# Patient Record
Sex: Female | Born: 1980 | Race: Black or African American
Health system: Southern US, Community
[De-identification: ages and names within clinical notes are randomized; demographics above are authoritative.]

## PROBLEM LIST (undated history)

## (undated) HISTORY — PX: HYSTERECTOMY: SHX81

---

## 2003-04-08 ENCOUNTER — Emergency Department: Admit: 2003-04-08 | Payer: Self-pay | Admitting: Emergency Medicine

## 2003-07-22 ENCOUNTER — Emergency Department: Admit: 2003-07-22 | Payer: Self-pay | Source: Emergency Department | Admitting: Emergency Medicine

## 2003-10-04 ENCOUNTER — Emergency Department: Admit: 2003-10-04 | Payer: Self-pay | Source: Ambulatory Visit | Admitting: Emergency Medicine

## 2003-10-24 ENCOUNTER — Emergency Department: Admit: 2003-10-24 | Payer: Self-pay

## 2021-03-16 ENCOUNTER — Emergency Department: Payer: Commercial Managed Care - POS

## 2021-03-16 ENCOUNTER — Observation Stay
Admission: EM | Admit: 2021-03-16 | Discharge: 2021-03-17 | Disposition: A | Discharge status: Home / Self Care | Payer: Commercial Managed Care - POS | Attending: Student in an Organized Health Care Education/Training Program | Admitting: Student in an Organized Health Care Education/Training Program

## 2021-03-16 DIAGNOSIS — D509 Iron deficiency anemia, unspecified: Secondary | ICD-10-CM | POA: Insufficient documentation

## 2021-03-16 DIAGNOSIS — R9431 Abnormal electrocardiogram [ECG] [EKG]: Secondary | ICD-10-CM | POA: Insufficient documentation

## 2021-03-16 DIAGNOSIS — R072 Precordial pain: Principal | ICD-10-CM | POA: Insufficient documentation

## 2021-03-16 DIAGNOSIS — Z20822 Contact with and (suspected) exposure to covid-19: Secondary | ICD-10-CM | POA: Insufficient documentation

## 2021-03-16 DIAGNOSIS — Z880 Allergy status to penicillin: Secondary | ICD-10-CM | POA: Insufficient documentation

## 2021-03-16 DIAGNOSIS — R0602 Shortness of breath: Secondary | ICD-10-CM | POA: Insufficient documentation

## 2021-03-16 DIAGNOSIS — R079 Chest pain, unspecified: Secondary | ICD-10-CM | POA: Diagnosis present

## 2021-03-16 DIAGNOSIS — R0789 Other chest pain: Secondary | ICD-10-CM

## 2021-03-16 LAB — CBC AND DIFFERENTIAL
Absolute NRBC: 0 10*3/uL (ref 0.00–0.00)
Basophils Absolute Automated: 0.05 10*3/uL (ref 0.00–0.08)
Basophils Automated: 1 %
Eosinophils Absolute Automated: 0.01 10*3/uL (ref 0.00–0.44)
Eosinophils Automated: 0.2 %
Hematocrit: 29.2 % — ABNORMAL LOW (ref 34.7–43.7)
Hgb: 8.9 g/dL — ABNORMAL LOW (ref 11.4–14.8)
Immature Granulocytes Absolute: 0.02 10*3/uL (ref 0.00–0.07)
Immature Granulocytes: 0.4 %
Lymphocytes Absolute Automated: 1.01 10*3/uL (ref 0.42–3.22)
Lymphocytes Automated: 20 %
MCH: 18.3 pg — ABNORMAL LOW (ref 25.1–33.5)
MCHC: 30.5 g/dL — ABNORMAL LOW (ref 31.5–35.8)
MCV: 60 fL — ABNORMAL LOW (ref 78.0–96.0)
Monocytes Absolute Automated: 0.39 10*3/uL (ref 0.21–0.85)
Monocytes: 7.7 %
Neutrophils Absolute: 3.57 10*3/uL (ref 1.10–6.33)
Neutrophils: 70.7 %
Nucleated RBC: 0 /100 WBC (ref 0.0–0.0)
Platelets: 479 10*3/uL — ABNORMAL HIGH (ref 142–346)
RBC: 4.87 10*6/uL (ref 3.90–5.10)
RDW: 20 % — ABNORMAL HIGH (ref 11–15)
WBC: 5.05 10*3/uL (ref 3.10–9.50)

## 2021-03-16 LAB — IHS 2ND ABORH REQUEST

## 2021-03-16 LAB — COMPREHENSIVE METABOLIC PANEL
ALT: 10 U/L (ref 0–55)
AST (SGOT): 13 U/L (ref 5–34)
Albumin/Globulin Ratio: 0.9 (ref 0.9–2.2)
Albumin: 4.1 g/dL (ref 3.5–5.0)
Alkaline Phosphatase: 63 U/L (ref 37–117)
Anion Gap: 9 (ref 5.0–15.0)
BUN: 7 mg/dL (ref 7.0–19.0)
Bilirubin, Total: 1.1 mg/dL (ref 0.2–1.2)
CO2: 23 mEq/L (ref 22–29)
Calcium: 9.8 mg/dL (ref 8.5–10.5)
Chloride: 106 mEq/L (ref 100–111)
Creatinine: 0.7 mg/dL (ref 0.6–1.0)
Globulin: 4.7 g/dL — ABNORMAL HIGH (ref 2.0–3.6)
Glucose: 75 mg/dL (ref 70–100)
Potassium: 3.9 mEq/L (ref 3.5–5.1)
Protein, Total: 8.8 g/dL — ABNORMAL HIGH (ref 6.0–8.3)
Sodium: 138 mEq/L (ref 136–145)

## 2021-03-16 LAB — ECG 12-LEAD
Atrial Rate: 105 {beats}/min
P Axis: 57 degrees
P-R Interval: 152 ms
Q-T Interval: 358 ms
QRS Duration: 82 ms
QTC Calculation (Bezet): 473 ms
R Axis: -19 degrees
T Axis: 59 degrees
Ventricular Rate: 105 {beats}/min

## 2021-03-16 LAB — URINALYSIS, REFLEX TO MICROSCOPIC EXAM IF INDICATED
Bilirubin, UA: NEGATIVE
Blood, UA: NEGATIVE
Glucose, UA: NEGATIVE
Ketones UA: NEGATIVE
Nitrite, UA: NEGATIVE
Specific Gravity UA: >=1.03 (ref 1.001–1.035)
Urine pH: 6 (ref 5.0–8.0)
Urobilinogen, UA: 0.2 mg/dL (ref 0.2–2.0)

## 2021-03-16 LAB — TROPONIN I
Troponin I: <0.01 ng/mL (ref 0.00–0.05)
Troponin I: 0.01 ng/mL (ref 0.00–0.05)

## 2021-03-16 LAB — CELL MORPHOLOGY
Cell Morphology: ABNORMAL — AB
Platelet Estimate: INCREASED — AB

## 2021-03-16 LAB — HCG QUANTITATIVE: hCG, Quant.: <2.4 m[IU]/mL

## 2021-03-16 LAB — GFR: EGFR: >60

## 2021-03-16 LAB — TYPE AND SCREEN
AB Screen Gel: NEGATIVE
ABO Rh: O POS

## 2021-03-16 LAB — COVID-19 (SARS-COV-2): SARS CoV-2: NEGATIVE

## 2021-03-16 LAB — ABO/RH: ABO Rh: O POS

## 2021-03-16 MED ORDER — ACETAMINOPHEN 650 MG RE SUPP
650.0000 mg | Freq: Four times a day (QID) | RECTAL | Status: DC | PRN
Start: 2021-03-16 — End: 2021-03-17

## 2021-03-16 MED ORDER — MAGNESIUM SULFATE IN D5W 1-5 GM/100ML-% IV SOLN
1.0000 g | INTRAVENOUS | Status: DC | PRN
Start: 2021-03-16 — End: 2021-03-17

## 2021-03-16 MED ORDER — ACETAMINOPHEN 325 MG PO TABS
650.0000 mg | ORAL_TABLET | Freq: Four times a day (QID) | ORAL | Status: DC | PRN
Start: 2021-03-16 — End: 2021-03-17

## 2021-03-16 MED ORDER — DEXTROSE 50 % IV SOLN
25.0000 g | INTRAVENOUS | Status: DC | PRN
Start: 2021-03-16 — End: 2021-03-17

## 2021-03-16 MED ORDER — OXYCODONE-ACETAMINOPHEN 5-325 MG PO TABS
1.0000 | ORAL_TABLET | ORAL | Status: DC | PRN
Start: 2021-03-16 — End: 2021-03-17
  Administered 2021-03-16: 1 via ORAL
  Filled 2021-03-16: qty 1

## 2021-03-16 MED ORDER — FENTANYL CITRATE (PF) 50 MCG/ML IJ SOLN (WRAP)
25.0000 ug | Freq: Once | INTRAMUSCULAR | Status: AC
Start: 2021-03-16 — End: 2021-03-16
  Administered 2021-03-16: 25 ug via INTRAVENOUS
  Filled 2021-03-16: qty 2

## 2021-03-16 MED ORDER — IOHEXOL 350 MG/ML IV SOLN
100.0000 mL | Freq: Once | INTRAVENOUS | Status: AC | PRN
Start: 2021-03-16 — End: 2021-03-16
  Administered 2021-03-16: 100 mL via INTRAVENOUS

## 2021-03-16 MED ORDER — POTASSIUM CHLORIDE CRYS ER 10 MEQ PO TBCR
0.0000 meq | EXTENDED_RELEASE_TABLET | ORAL | Status: DC | PRN
Start: 2021-03-16 — End: 2021-03-17

## 2021-03-16 MED ORDER — ONDANSETRON HCL 4 MG/2ML IJ SOLN
4.0000 mg | Freq: Four times a day (QID) | INTRAMUSCULAR | Status: DC | PRN
Start: 2021-03-16 — End: 2021-03-17

## 2021-03-16 MED ORDER — DEXTROSE 10 % IV BOLUS
25.0000 g | INTRAVENOUS | Status: DC | PRN
Start: 2021-03-16 — End: 2021-03-17

## 2021-03-16 MED ORDER — NALOXONE HCL 0.4 MG/ML IJ SOLN (WRAP)
0.2000 mg | INTRAMUSCULAR | Status: DC | PRN
Start: 2021-03-16 — End: 2021-03-17

## 2021-03-16 MED ORDER — MELATONIN 3 MG PO TABS
3.0000 mg | ORAL_TABLET | Freq: Every evening | ORAL | Status: DC | PRN
Start: 2021-03-16 — End: 2021-03-17

## 2021-03-16 MED ORDER — POTASSIUM CHLORIDE 10 MEQ/100ML IV SOLN
10.0000 meq | INTRAVENOUS | Status: DC | PRN
Start: 2021-03-16 — End: 2021-03-17

## 2021-03-16 MED ORDER — ENOXAPARIN SODIUM 40 MG/0.4ML IJ SOSY
40.0000 mg | PREFILLED_SYRINGE | Freq: Every day | INTRAMUSCULAR | Status: DC
Start: 2021-03-16 — End: 2021-03-17
  Administered 2021-03-16: 19:00:00 40 mg via SUBCUTANEOUS
  Filled 2021-03-16 (×2): qty 0.4

## 2021-03-16 MED ORDER — POTASSIUM & SODIUM PHOSPHATES 280-160-250 MG PO PACK
2.0000 | PACK | ORAL | Status: DC | PRN
Start: 2021-03-16 — End: 2021-03-17

## 2021-03-16 MED ORDER — FERROUS SULFATE 324 (65 FE) MG PO TBEC
324.0000 mg | DELAYED_RELEASE_TABLET | Freq: Every day | ORAL | Status: DC
Start: 2021-03-17 — End: 2021-03-17
  Administered 2021-03-17: 324 mg via ORAL
  Filled 2021-03-16: qty 1

## 2021-03-16 MED ORDER — ONDANSETRON 4 MG PO TBDP
4.0000 mg | ORAL_TABLET | Freq: Four times a day (QID) | ORAL | Status: DC | PRN
Start: 2021-03-16 — End: 2021-03-17

## 2021-03-16 MED ORDER — GLUCAGON 1 MG IJ SOLR (WRAP)
1.0000 mg | INTRAMUSCULAR | Status: DC | PRN
Start: 2021-03-16 — End: 2021-03-17

## 2021-03-16 MED ORDER — DEXTROSE 5% IV BOLUS
250.0000 mL | INTRAVENOUS | Status: DC | PRN
Start: 2021-03-16 — End: 2021-03-17

## 2021-03-16 NOTE — ED Triage Notes (Signed)
Pt. BIB husband c/o CP, SOB.  Pt. States she had hysterectomy x3 weeks ago.  Pt. Denies n/v/d.  Pt. Is A+Ox4, moving all extremities with no other medical complaints.    BP 111/73    Pulse 95    Temp 99.7 F (37.6 C)    Resp (!) 26    SpO2 99%

## 2021-03-16 NOTE — H&P (Signed)
[facility]      Patient: Heather Bond  Date: 03/16/2021   DOB: Sep 12, 1980  Admission Date: 03/16/2021   MRN: [id number]  Attending: Derek Mound, MD         Chief Complaint   Patient presents with    Chest Pain    Shortness of Breath      History Gathered From: Patient     HISTORY AND PHYSICAL     Heather Bond is a 40 y.o. female with a PMHx of anemia who presented with chest tightness, shortness of breath and "fogginess" and broke out in a rash on forehead. Initially attributed this to an allergic reaction so took benadryl and wen to sleep. This morning she felt similar symptoms so decided to come in to the ED. She states that she has been going through a lot of stressors. She recently has been dealing with the sudden death of her cousin on 01-06-23. She reports abd pain from recently surgery- hysterectomy. Denies nausea/vomiting.     In the ED:  CTA chest: negative for PE     EKG NSR with lateral T wave inverstions   Troponin negative x1     Cardiology consulted [facility] recommend admission and stress test.     History reviewed. No pertinent past medical history.    Past Surgical History:   Procedure Laterality Date    HYSTERECTOMY         Prior to Admission medications    Medication Sig Start Date End Date Taking? Authorizing Provider   ferrous sulfate 325 (65 FE) MG tablet Take 325 mg by mouth every morning with breakfast    [provider]   ibuprofen (ADVIL) 800 MG tablet Take 800 mg by mouth every 6 (six) hours as needed for Pain    [provider]   Meth-Hyo-M Bl-Na Phos-Ph Sal (URO-MP PO) Take by mouth    [provider]   naproxen sodium (ANAPROX) 550 MG tablet Take 550 mg by mouth 2 (two) times daily with meals    [provider]   ondansetron (ZOFRAN-ODT) 4 MG disintegrating tablet Take 4 mg by mouth every 8 (eight) hours as needed for Nausea    [provider]   oxyCODONE-acetaminophen (PERCOCET) 5-325 MG per tablet Take 1 tablet by mouth  every 4 (four) hours as needed for Pain    [provider]       Allergies   Allergen Reactions    Penicillins        CODE STATUS: Full    PRIMARY CARE MD: No primary care provider on file.    Family hx:  Mother- denies htn, dm, malignancy, heart disease   Father- as above     Social History     Tobacco Use    Smoking status: Never    Smokeless tobacco: Never   Vaping Use    Vaping Use: Never used   Substance Use Topics    Alcohol use: Never    Drug use: Never       REVIEW OF SYSTEMS   As above     PHYSICAL EXAM     Vital Signs (most recent): BP 102/63    Pulse 79    Temp 99.7 F (37.6 C)    Resp 18    Ht 1.753 m (5\' 9" )    Wt 63.5 kg (140 lb)    SpO2 100%    BMI 20.67 kg/m   Constitutional: No apparent distress. Patient speaks freely  in full sentences.   HEENT: NC/AT, PERRL, no scleral icterus or conjunctival pallor, no nasal discharge, MMM, oropharynx without erythema or exudate  Neck: trachea midline, supple, no cervical or supraclavicular lymphadenopathy or masses  Cardiovascular: RRR, normal S1 S2, no murmurs, gallops, palpable thrills, no JVD, Non-displaced PMI.  Respiratory: Normal rate. No retractions or increased work of breathing. Clear to auscultation and percussion bilaterally.  Gastrointestinal: +BS, non-distended, soft, non-tender, no rebound or guarding, no hepatosplenomegaly  Genitourinary: no suprapubic or costovertebral angle tenderness  Musculoskeletal: ROM and motor strength grossly normal. No clubbing, edema, or cyanosis. DP and radial pulses 2+ and symmetric.  Skin exam:  pink  Neurologic: non focal, moves all extremities   Psychiatric: AAOx3, affect and mood appropriate. The patient is alert, interactive, appropriate.  Capillary refill:  Normal    Exam done by Derek Mound, MD on 03/16/21 at 2:14 PM      LABS & IMAGING     Recent Results (from the past 24 hour(s))   COVID-19 (SARS-COV-2) ([facility])    Collection Time: 03/16/21 12:01 PM    Specimen: Nasopharynx; Nasopharyngeal  Swab   Result Value Ref Range    Purpose of COVID testing Screening     SARS-CoV-2 Specimen Source Nasopharyngeal     SARS CoV-2 Negative    CBC and differential    Collection Time: 03/16/21 12:01 PM   Result Value Ref Range    WBC 5.05 3.10 - 9.50 x10 3/uL    Hgb 8.9 (L) 11.4 - 14.8 g/dL    Hematocrit 16.1 (L) 34.7 - 43.7 %    Platelets 479 (H) 142 - 346 x10 3/uL    RBC 4.87 3.90 - 5.10 x10 6/uL    MCV 60.0 (L) 78.0 - 96.0 fL    MCH 18.3 (L) 25.1 - 33.5 pg    MCHC 30.5 (L) 31.5 - 35.8 g/dL    RDW 20 (H) 11 - 15 %    MPV Unmeasured 8.9 - 12.5 fL    Nucleated RBC 0.0 0.0 - 0.0 /100 WBC    Absolute NRBC 0.00 0.00 - 0.00 x10 3/uL   Comprehensive metabolic panel    Collection Time: 03/16/21 12:01 PM   Result Value Ref Range    Glucose 75 70 - 100 mg/dL    BUN 7.0 7.0 - 09.6 mg/dL    Creatinine 0.7 0.6 - 1.0 mg/dL    Sodium 045 409 - 811 mEq/L    Potassium 3.9 3.5 - 5.1 mEq/L    Chloride 106 100 - 111 mEq/L    CO2 23 22 - 29 mEq/L    Calcium 9.8 8.5 - 10.5 mg/dL    Protein, Total 8.8 (H) 6.0 - 8.3 g/dL    Albumin 4.1 3.5 - 5.0 g/dL    AST (SGOT) 13 5 - 34 U/L    ALT 10 0 - 55 U/L    Alkaline Phosphatase 63 37 - 117 U/L    Bilirubin, Total 1.1 0.2 - 1.2 mg/dL    Globulin 4.7 (H) 2.0 - 3.6 g/dL    Albumin/Globulin Ratio 0.9 0.9 - 2.2    Anion Gap 9.0 5.0 - 15.0   Troponin I    Collection Time: 03/16/21 12:01 PM   Result Value Ref Range    Troponin I <0.01 0.00 - 0.05 ng/mL   GFR    Collection Time: 03/16/21 12:01 PM   Result Value Ref Range    EGFR >60.0    Type and Screen  Collection Time: 03/16/21 12:01 PM   Result Value Ref Range    ABO Rh O POS     AB Screen Gel NEG    2nd ABORH Request    Collection Time: 03/16/21 12:01 PM   Result Value Ref Range    2nd ABORH Request FT    ABO/Rh    Collection Time: 03/16/21 12:47 PM   Result Value Ref Range    ABO Rh O POS      IMAGING:  CT Angio Chest    Result Date: 03/16/2021    CT angiogram of the chest shows no central or detectable peripheral pulmonary embolism. Laurena Slimmer,  MD  03/16/2021 1:12 PM       Markers:  Recent Labs   Lab 03/16/21  1201   Troponin I <0.01       EMERGENCY DEPARTMENT COURSE:  Orders Placed This Encounter   Procedures    COVID-19 (SARS-COV-2) ([facility])    CT Angio Chest    CBC and differential    Comprehensive metabolic panel    Troponin I    GFR    Full Code    ECG 12 Lead    Type and Screen    2nd ABORH Request    ABO/Rh    Admit to Observation       ASSESSMENT & PLAN     Mazi Umstead is a 40 y.o. female admitted under obs with Chest pain.    Patient Active Hospital Problem List:  Chest pain (03/16/2021)     Chest pain, resolved  -Likely 2/2 anxiety given recent stress  -Given abnormal EKG with lateral T wave inversions pt admitted for further workup   -Stress test in am   -Cariology consulted, appreciate recs  -NPO at midnight   -Troponin negative. Serial troponins ordered  -CTA chest negative for PE  -Monitor on tele   -Check lipids and A1c    Iron def anemia   -S/p hysterectomy  -Continue PTA ferrous sulfate   -OP follow up       Nutrition  Regular  NPO at midnight     DVT/VTE Prophylaxis  Lovenox     Signed,  Derek Mound, MD    03/16/2021 2:14 PM  Time Elapsed: 45 minutes

## 2021-03-16 NOTE — Consults (Signed)
[facility] C[facility] CONSULTATION REPORT  [facility]        Date Time: 03/16/21 2:45 PM  Patient Name: Edberg,Melady  Requesting Physician: Derek Mound, MD  Consulting Cardiologist:  Lawerance Bach, MD  MRN:  [id number]  CSN:   [id number]  Date of Admission:  03/16/2021       Reason for Consultation:   Chest pain    History:   Nakisha Chai is a 40 y.o. female admitted on 03/16/2021.  We have been asked by Derek Mound, MD,  to provide cardiac consultation, regarding chest pain.    The patient says she developed chest discomfort (pressure) associated with headache and "fogginess" this morning. She has no known history of coronary disease, diabetes or stroke.    [place] She says she has felt stressed recently with the passing of cousin 3 days ago and the 1st anniversary of her mother's death approaching this week.    Additionally, she was admitted in May 2022 with severe anemia (reportedly Hgb of 2) in the setting of massive uterine bleeding. She then underwent fibroidectomy in July 2022 with subsequent improvement in her blood count. She continues to take prn oxycodone for incisional pain.    She is never smoker and denies alcohol and drug use.    In the ED, initial troponin is negative.  CTA chest:   CT angiogram of the chest shows no central or detectable  peripheral pulmonary embolism.     EKG:  Sinus rhythm with lateral T wave inversions.    Past Medical History:   History reviewed. No pertinent past medical history.    Past Surgical History:     Past Surgical History:   Procedure Laterality Date    HYSTERECTOMY         Family History:   History reviewed. No pertinent family history.    Social History:     Social History     Socioeconomic History    Marital status: Single     Spouse name: Not on file    Number of children: Not on file    Years of education: Not on file    Highest education level: Not on file   Occupational  History    Not on file   Tobacco Use    Smoking status: Never    Smokeless tobacco: Never   Vaping Use    Vaping Use: Never used   Substance and Sexual Activity    Alcohol use: Never    Drug use: Never    Sexual activity: Not on file   Other Topics Concern    Not on file   Social History Narrative    Not on file     Social Determinants of Health     Financial Resource Strain: Not on file   Food Insecurity: Not on file   Transportation Needs: Not on file   Physical Activity: Not on file   Stress: Not on file   Social Connections: Not on file   Intimate Partner Violence: Not on file   Housing Stability: Not on file       Allergies:     Allergies   Allergen Reactions    Penicillins        Medications:   (Not in a hospital admission)           Review of Systems:    Comprehensive review of systems including constitutional, eyes, ears, nose, mouth, throat, cardiovascular,  GI, GU, musculoskeletal, integumentary, respiratory, neurologic, psychiatric, and endocrine is negative other than what is mentioned already in the history of present illness    Physical Exam:     VITAL SIGNS PHYSICAL EXAM   Vitals:    03/16/21 1408   BP: 102/63   Pulse: 79   Resp: 18   Temp:    SpO2: 100%     Temp (24hrs), Avg:99.7 F (37.6 C), Min:99.7 F (37.6 C), Max:99.7 F (37.6 C)      Intake and Output Summary (Last 24 hours) at Date Time  No intake or output data in the 24 hours ending 03/16/21 1445    Telemetry: no changes Physical Exam  General: awake, alert, breathing comfortably, no acute distress  Head: normocephalic  Eyes: Lids & conjunctiva normal  Cardiovascular: regular rate and rhythm, normal S1, S2, no S3, no S4, no murmurs, rubs or gallops  Neck: No JVD;  No Carotid bruit  Lungs: clear to auscultation bilateraly, without wheezing, rhonchi, or rales  Abdomen: soft, non-tender.  Extremities: no edema  Pulse: 2+ radial pulses equal bilaterally   Neurological: No gross motor defect  Psychiatric: Alert and oriented X3, mood and affect  normal  Musculoskeletal: normal strength and tone     Labs Reviewed:     Recent Labs   Lab 03/16/21  1201   Troponin I <0.01             Recent Labs   Lab 03/16/21  1201   Bilirubin, Total 1.1   Protein, Total 8.8*   Albumin 4.1   ALT 10   AST (SGOT) 13             Recent Labs   Lab 03/16/21  1201   WBC 5.05   Hgb 8.9*   Hematocrit 29.2*   Platelets 479*     Recent Labs   Lab 03/16/21  1201   Sodium 138   Potassium 3.9   Chloride 106   CO2 23   BUN 7.0   Creatinine 0.7   EGFR >60.0   Glucose 75   Calcium 9.8       DIAGNOSTIC    EKG:     chest X-ray    Assessment:   Atypical chest pain: Substernal chest pressure in the setting of emotional stress and lateral T wave inversions. No known CAD. Chest pain has now improved. Troponin negative. Negative for pulmonary embolism.    Recommendations:   Regadenoson nuclear stress test.        Signed by: Lawerance Bach, MD    [facility]  MD Spectralink [phone] or [phone] (8am-5pm)  Office/Pager: [phone]  NP Spectralink [phone]   After hours, non urgent consult line [phone]  After Hours, urgent consults [phone]

## 2021-03-16 NOTE — Progress Notes (Signed)
Pt is a 40 years old female admitted to unit with diagnosis of Chest pain.She is alert and oriented ,able to verbalizes needs. On room air  with 100%sat. Oriented to room with call light within reached. Ambulate to the bathroom by self.

## 2021-03-16 NOTE — ED Notes (Signed)
[facility] EMERGENCY DEPARTMENT  ED NURSING NOTE FOR THE RECEIVING INPATIENT NURSE   ED NURSE Francis Dowse [phone]   ED CHARGE RN [phone]   ADMISSION INFORMATION   Heather Bond is a 39 y.o. female admitted with a diagnosis of:    1. Chest pain         Isolation: None   Allergies: Penicillins   Holding Orders confirmed? Yes   Belongings Documented? Yes   Home medications sent to pharmacy confirmed? No   NURSING CARE   Patient Comes From:   Mental Status: Home Independent  alert and oriented   ADL: Independent with all ADLs   Ambulation: no difficulty   Pertinent Information  and Safety Concerns: Pt here for observation of chest pain and SOB. Pt recently had hysterectomy and required several blood transfusions.      COVID Test sent to lab? Yes   VITAL SIGNS   Time BP Temp Pulse Resp SpO2   1408 102/63 99.7 79 18 100   CT / NIH   CT Head ordered on this patient?  No   NIH/Dysphagia assessment done prior to admission? No   PERSONAL PROTECTIVE EQUIPMENT   Gloves and Goggles   LAB RESULTS   Labs Reviewed   CBC AND DIFFERENTIAL - Abnormal; Notable for the following components:       Result Value    Hgb 8.9 (*)     Hematocrit 29.2 (*)     Platelets 479 (*)     MCV 60.0 (*)     MCH 18.3 (*)     MCHC 30.5 (*)     RDW 20 (*)     All other components within normal limits   COMPREHENSIVE METABOLIC PANEL - Abnormal; Notable for the following components:    Protein, Total 8.8 (*)     Globulin 4.7 (*)     All other components within normal limits   COVID-19 (SARS-COV-2)    Narrative:     o Collect and clearly label specimen type:  o Upper respiratory specimen: One Nasopharyngeal Dry Swab NO  Transport Media.  o Hand deliver to laboratory ASAP  Indication for testing->Extended care facility admission to  semi private room  Screening   TROPONIN I   GFR   TYPE AND SCREEN   [facility] 2ND ABORH REQUEST   ABO/RH          Ticket to [employer]: Yes

## 2021-03-16 NOTE — Plan of Care (Signed)
Pt alert and oriented. Tolerated diet as ordered. Ambulate to the bathroom by self. Call light within reached.    Problem: Safety  Goal: Patient will be free from injury during hospitalization  Flowsheets (Taken 03/16/2021 1831)  Patient will be free from injury during hospitalization:   Assess patient's risk for falls and implement fall prevention plan of care per policy   Provide and maintain safe environment   Use appropriate transfer methods   Ensure appropriate safety devices are available at the bedside   Include patient/ family/ care giver in decisions related to safety   Hourly rounding     Problem: Pain  Goal: Pain at adequate level as identified by patient  Flowsheets (Taken 03/16/2021 1831)  Pain at adequate level as identified by patient:   Identify patient comfort function goal   Assess for risk of opioid induced respiratory depression, including snoring/sleep apnea. Alert healthcare team of risk factors identified.   Assess pain on admission, during daily assessment and/or before any "as needed" intervention(s)   Reassess pain within 30-60 minutes of any procedure/intervention, per Pain Assessment, Intervention, Reassessment (AIR) Cycle   Evaluate if patient comfort function goal is met   Evaluate patient's satisfaction with pain management progress   Offer non-pharmacological pain management interventions     Problem: Side Effects from Pain Analgesia  Goal: Patient will experience minimal side effects of analgesic therapy  Flowsheets (Taken 03/16/2021 1832)  Patient will experience minimal side effects of analgesic therapy:   Monitor/assess patient's respiratory status (RR depth, effort, breath sounds)   Assess for changes in cognitive function   Prevent/manage side effects per LIP orders (i.e. nausea, vomiting, pruritus, constipation, urinary retention, etc.)   Evaluate for opioid-induced sedation with appropriate assessment tool (i.e. POSS)     Problem: Discharge Barriers  Goal: Patient will be  discharged home or other facility with appropriate resources  Flowsheets (Taken 03/16/2021 1831)  Discharge to home or other facility with appropriate resources:   Provide appropriate patient education   Provide information on available health resources   Initiate discharge planning     Problem: Psychosocial and Spiritual Needs  Goal: Demonstrates ability to cope with hospitalization/illness  Flowsheets (Taken 03/16/2021 1832)  Demonstrates ability to cope with hospitalizations/illness:   Encourage verbalization of feelings/concerns/expectations   Provide quiet environment   Assist patient to identify own strengths and abilities   Encourage patient to set small goals for self   Encourage participation in diversional activity   Reinforce positive adaptation of new coping behaviors   Include patient/ patient care companion in decisions   Communicate referral to spiritual care as appropriate

## 2021-03-16 NOTE — ED Provider Notes (Signed)
EMERGENCY DEPARTMENT HISTORY AND PHYSICAL EXAM     None        Date: 03/16/2021  Patient Name: Heather Bond    History of Presenting Illness     Chief Complaint   Patient presents with    Chest Pain    Shortness of Breath       History Provided By: patient    History: Heather Bond is a 40 y.o. female presenting to the ED with moderate severity, sudden onset, central nonradiating heavy chest pain with associated shortness of breath that started about 1.5 hours prior to arrival today.  No clear exacerbating relieving factors to symptoms.  Patient also feels lightheaded and weak.  She had hysterectomy 2 weeks ago complicated by severe blood loss, she states hemoglobin felt to 1.5 and she required multiple units of blood transfusions.  Denies any baseline medical problems except for fibroids.  No history of PE or DVT.  No exogenous hormone use.  No fever, chills, sore throat, runny nose, or cough.  No clear exacerbating or relieving factors symptoms.  She states she has not had any issues with bleeding since the surgery.      PCP: Pcp, None, MD  SPECIALISTS:    Current Facility-Administered Medications   Medication Dose Route Frequency Provider Last Rate Last Admin    acetaminophen (TYLENOL) tablet 650 mg  650 mg Oral Q6H PRN Derek Mound, MD        Or    acetaminophen (TYLENOL) suppository 650 mg  650 mg Rectal Q6H PRN Matiana, Suman D, MD        glucagon (rDNA) (GLUCAGEN) injection 1 mg  1 mg Intramuscular PRN Matiana, Suman D, MD        And    dextrose 5 % bolus 250 mL  250 mL Intravenous PRN Matiana, Suman D, MD        And    dextrose 50 % bolus 25 g  25 g Intravenous PRN Matiana, Suman D, MD        And    dextrose (D10W) 10% bolus 250 mL  25 g Intravenous PRN Matiana, Suman D, MD        enoxaparin (LOVENOX) syringe 40 mg  40 mg Subcutaneous Daily Doy Hutching D, MD   40 mg at 03/16/21 1918    ferrous sulfate EC tablet 324 mg  324 mg Oral Daily Matiana, Suman D, MD        magnesium sulfate 1g in  dextrose 5% IVPB (premix)  1 g Intravenous PRN Matiana, Suman D, MD        melatonin tablet 3 mg  3 mg Oral QHS PRN Matiana, Raiford Noble, MD        naloxone (NARCAN) injection 0.2 mg  0.2 mg Intravenous PRN Matiana, Suman D, MD        ondansetron (ZOFRAN-ODT) disintegrating tablet 4 mg  4 mg Oral Q6H PRN Matiana, Suman D, MD        Or    ondansetron (ZOFRAN) injection 4 mg  4 mg Intravenous Q6H PRN Matiana, Suman D, MD        oxyCODONE-acetaminophen (PERCOCET) 5-325 MG per tablet 1 tablet  1 tablet Oral Q4H PRN Doy Hutching D, MD   1 tablet at 03/16/21 2310    potassium & sodium phosphates (PHOS-NAK) 280-160-250 MG packet 2 packet  2 packet Oral PRN Doy Hutching D, MD        potassium chloride (KLOR-CON) CR tablet 0-40 mEq  0-40 mEq Oral PRN Doy Hutching D, MD        And    potassium chloride 10 mEq in 100 mL IVPB (premix)  10 mEq Intravenous PRN Derek Mound, MD           Past History     Past Medical History:  History reviewed. No pertinent past medical history.    Past Surgical History:  Past Surgical History:   Procedure Laterality Date    HYSTERECTOMY         Family History:  History reviewed. No pertinent family history.    Social History:  Social History     Tobacco Use    Smoking status: Never    Smokeless tobacco: Never   Vaping Use    Vaping Use: Never used   Substance Use Topics    Alcohol use: Never    Drug use: Never       Allergies:  Allergies   Allergen Reactions    Penicillins        Review of Systems     Review of Systems: All other systems reviewed and negative.         Physical Exam   BP 105/50   Pulse 83   Temp 98.8 F (37.1 C) (Oral)   Resp 17   Ht 5\' 9"  (1.753 m)   Wt 64.5 kg   SpO2 99%   BMI 20.98 kg/m     Constitutional: Vital signs reviewed. Pt appears anxious  Head: Normocephalic, atraumatic  Eyes: Conjunctiva and sclera are normal.  No injection or discharge.  Ears, Nose, Throat:  Normal external examination of the nose and ears. No throat or oropharyngeal swelling or  erythema. Midline uvula. Mucous membranes moist.  Neck: Normal range of motion. Supple, no meningeal signs. Trachea midline. No stridor. No JVD  Respiratory/Chest: Clear to auscultation. No respiratory distress.   Cardiovascular: Regular rate and rhythm; tachycardic. No murmurs.  Abdomen:  Bowel sounds intact. No rebound or guarding. Soft.  Non-tender.  Back: No CVA tenderness to percussion. No focal tenderness.  Upper Extremity:  No edema. No cyanosis. Bilateral radial pulses intact and equal.   Lower Extremity:  No edema. No cyanosis. Bilateral calves symmetrical and non-tender. Bilateral femoral, DP, PT pulses intact and equal.  Skin: Warm and dry. No rash.  Neuro: Cranial nerves grossly intact.  Moves all extremities spontaneously.  Psychiatric: Anxious affect.  Normal insight.      Diagnostic Study Results     Labs -     Results       Procedure Component Value Units Date/Time    Urinalysis Reflex to Microscopic Exam [[id number]]  (Abnormal) Collected: 03/16/21 2311    Specimen: Urine Updated: 03/16/21 2332     Urine Type Clean Catch     Color, UA Colorless     Clarity, UA Clear     Specific Gravity UA >=1.030     Urine pH 6.0     Leukocyte Esterase, UA trace     Nitrite, UA Negative     Protein, UR trace     Glucose, UA Negative     Ketones UA Negative     Urobilinogen, UA 0.2 mg/dL      Bilirubin, UA Negative     Blood, UA Negative     RBC, UA 6 - 10 /hpf      WBC, UA 0 - 5 /hpf      Squamous Epithelial Cells, Urine 0 - 5 /hpf  Urine Mucus Present    Troponin I [[id number]] Collected: 03/16/21 1851    Specimen: Blood Updated: 03/16/21 1924     Troponin I 0.01 ng/mL     Beta HCG Quantitative [[id number]] Collected: 03/16/21 1851     Updated: 03/16/21 1923     hCG, Quant. <2.4 mIU/mL     Troponin I [[id number]] Collected: 03/16/21 1851    Specimen: Blood Updated: 03/16/21 1851    Cell MorpHology [[id number]]  (Abnormal) Collected: 03/16/21 1201     Updated: 03/16/21 1451     Cell Morphology Abnormal     Platelet  Estimate Increased     Microcytic 3+     Hypochromia 3+     Schistocytes 1+     Ovalocytes Present     Dual RBC Population Present    CBC and differential [[id number]]  (Abnormal) Collected: 03/16/21 1201    Specimen: Blood Updated: 03/16/21 1451     WBC 5.05 x10 3/uL      Hgb 8.9 g/dL      Hematocrit 24.4 %      Platelets 479 x10 3/uL      RBC 4.87 x10 6/uL      MCV 60.0 fL      MCH 18.3 pg      MCHC 30.5 g/dL      RDW 20 %      MPV Unmeasured fL      Neutrophils 70.7 %      Lymphocytes Automated 20.0 %      Monocytes 7.7 %      Eosinophils Automated 0.2 %      Basophils Automated 1.0 %      Immature Granulocytes 0.4 %      Nucleated RBC 0.0 /100 WBC      Neutrophils Absolute 3.57 x10 3/uL      Lymphocytes Absolute Automated 1.01 x10 3/uL      Monocytes Absolute Automated 0.39 x10 3/uL      Eosinophils Absolute Automated 0.01 x10 3/uL      Basophils Absolute Automated 0.05 x10 3/uL      Immature Granulocytes Absolute 0.02 x10 3/uL      Absolute NRBC 0.00 x10 3/uL     ABO/Rh [[id number]] Collected: 03/16/21 1247    Specimen: Blood Updated: 03/16/21 1321     ABO Rh O POS    Type and Screen [[id number]] Collected: 03/16/21 1201    Specimen: Blood Updated: 03/16/21 1253     ABO Rh O POS     AB Screen Gel NEG    COVID-19 (SARS-COV-2) (Magnet Rapid) [[id number]] Collected: 03/16/21 1201    Specimen: Nasopharyngeal Swab from Nasopharynx Updated: 03/16/21 1252     Purpose of COVID testing Screening     SARS-CoV-2 Specimen Source Nasopharyngeal     SARS CoV-2 Negative    Narrative:      o Collect and clearly label specimen type:  o Upper respiratory specimen: One Nasopharyngeal Dry Swab NO  Transport Media.  o Hand deliver to laboratory ASAP  Indication for testing->Extended care facility admission to  semi private room  Screening    Troponin I [[id number]] Collected: 03/16/21 1201    Specimen: Blood Updated: 03/16/21 1231     Troponin I <0.01 ng/mL     2nd [facility] Request [[id number]] Collected: 03/16/21 1201     Updated: 03/16/21  1231     2nd [facility] Request FT    Comprehensive metabolic panel [[id number]]  (Abnormal) Collected: 03/16/21 1201    Specimen: Blood  Updated: 03/16/21 1229     Glucose 75 mg/dL      BUN 7.0 mg/dL      Creatinine 0.7 mg/dL      Sodium 161 mEq/L      Potassium 3.9 mEq/L      Chloride 106 mEq/L      CO2 23 mEq/L      Calcium 9.8 mg/dL      Protein, Total 8.8 g/dL      Albumin 4.1 g/dL      AST (SGOT) 13 U/L      ALT 10 U/L      Alkaline Phosphatase 63 U/L      Bilirubin, Total 1.1 mg/dL      Globulin 4.7 g/dL      Albumin/Globulin Ratio 0.9     Anion Gap 9.0    GFR [[id number]] Collected: 03/16/21 1201     Updated: 03/16/21 1229     EGFR >60.0            Radiologic Studies -   Radiology Results (24 Hour)       Procedure Component Value Units Date/Time    CT Angio Chest [[id number]] Collected: 03/16/21 1310    Order Status: Completed Updated: 03/16/21 1314    Narrative:      INDICATION: pleuritic cp and sob s/p hysterectomy r/o pe.        TECHNIQUE: CT ANGIOGRAM CHEST:  Multidetector imaging was performed on a  multislice scanner from the thoracic inlet through the diaphragms.  Note: Note that CT scanning at this site  utilizes multiple dose  reduction techniques including automatic exposure control, adjustment of  the MAS and/or KVP according to patient's size and use of iterative  reconstruction technique IV Contrast: 100 cc  Omnipaque 350.  Acquisition phase of imaging: PE protocol. Oral contrast was not given.  Reconstructions: 3d MIP images in multiple planes      FINDINGS: There is good opacification of the pulmonary arteries. There  is no central pulmonary embolus or detectable peripheral pulmonary  embolus.   The thoracic aorta is nonaneurysmal and shows no evidence of dissection.     The lungs appear clear.    The heart is normal in size.    The mediastinum is within normal limits.  Upper abdomen unremarkable. No bony destructive lesion      Impression:        CT angiogram of the chest shows no central or  detectable  peripheral pulmonary embolism.    Laurena Slimmer, MD   03/16/2021 1:12 PM        .    Medical Decision Making   I am the first provider for this patient.    I reviewed the vital signs, available nursing notes, past medical history, past surgical history, family history and social history.    Vital Signs-Reviewed the patient's vital signs.   Patient Vitals for the past 12 hrs:   BP Temp Pulse Resp   03/16/21 2309 105/50 98.8 F (37.1 C) 83 17   03/16/21 1732 104/61 98.6 F (37 C) 71 17   03/16/21 1626 97/61 97.6 F (36.4 C) 73 16   03/16/21 1604 103/61 -- 91 17   03/16/21 1408 102/63 -- 79 18   03/16/21 1301 104/65 -- 84 17         EKG:  Interpreted by the EP.   Time Interpreted: 1150   Rate: 105   Rhythm: sinus tachycardia   Interpretation: no STEMI; + ST depression and  T wave inversion in V4-V6, T wave flattening II, III, AVF    Comparison: 10/04/2003-- now changes in lateral and inferior leads, new tachycardia         ED Course:     130 - d/aw [place] [facility] on call-- would admit for further evaluation given abnormal EKG           Diagnosis     Clinical Impression:   1. Chest pain        Treatment Plan:   ED Disposition       ED Disposition   Observation    Condition   --    Date/Time   Mon Mar 16, 2021  1:58 PM    Comment   Admitting Physician: Derek Mound [[id number]]   Service:: Medicine [[id number]]   Estimated Length of Stay: < 2 midnights   Tentative Discharge Plan?: Home or Self Care [1]   Does patient need telemetry?: Yes   Telemetry type (separate Telemetry order is also required):: Medical telemetry                   _______________________________    This note was generated by the Epic EMR system/ Dragon speech recognition and may contain inherent errors or omissions not intended by the user. Grammatical errors, random word insertions, deletions and pronoun errors  are occasional consequences of this technology due to software limitations. Not all errors are caught or corrected. If there are questions or  concerns about the content of this note or information contained within the body of this dictation they should be addressed directly with the author for clarification.      Attestations: This note is prepared by Lynnea Ferrier, MD    _______________________________       Maryella Shivers, MD  03/17/21 [phone]

## 2021-03-17 ENCOUNTER — Other Ambulatory Visit: Payer: Self-pay

## 2021-03-17 DIAGNOSIS — R079 Chest pain, unspecified: Secondary | ICD-10-CM

## 2021-03-17 LAB — COMPREHENSIVE METABOLIC PANEL
ALT: 10 U/L (ref 0–55)
AST (SGOT): 13 U/L (ref 5–34)
Albumin/Globulin Ratio: 0.9 (ref 0.9–2.2)
Albumin: 3.6 g/dL (ref 3.5–5.0)
Alkaline Phosphatase: 51 U/L (ref 37–117)
Anion Gap: 7 (ref 5.0–15.0)
BUN: 10 mg/dL (ref 7.0–19.0)
Bilirubin, Total: 0.8 mg/dL (ref 0.2–1.2)
CO2: 23 mEq/L (ref 22–29)
Calcium: 9 mg/dL (ref 8.5–10.5)
Chloride: 107 mEq/L (ref 100–111)
Creatinine: 0.6 mg/dL (ref 0.6–1.0)
Globulin: 3.9 g/dL — ABNORMAL HIGH (ref 2.0–3.6)
Glucose: 76 mg/dL (ref 70–100)
Potassium: 4.1 mEq/L (ref 3.5–5.1)
Protein, Total: 7.5 g/dL (ref 6.0–8.3)
Sodium: 137 mEq/L (ref 136–145)

## 2021-03-17 LAB — MAGNESIUM: Magnesium: 1.8 mg/dL (ref 1.6–2.6)

## 2021-03-17 LAB — CBC AND DIFFERENTIAL
Absolute NRBC: 0 10*3/uL (ref 0.00–0.00)
Basophils Absolute Automated: 0.05 10*3/uL (ref 0.00–0.08)
Basophils Automated: 1.1 %
Eosinophils Absolute Automated: 0.12 10*3/uL (ref 0.00–0.44)
Eosinophils Automated: 2.6 %
Hematocrit: 25.9 % — ABNORMAL LOW (ref 34.7–43.7)
Hgb: 7.8 g/dL — ABNORMAL LOW (ref 11.4–14.8)
Immature Granulocytes Absolute: 0.01 10*3/uL (ref 0.00–0.07)
Immature Granulocytes: 0.2 %
Lymphocytes Absolute Automated: 1.92 10*3/uL (ref 0.42–3.22)
Lymphocytes Automated: 41.1 %
MCH: 18.2 pg — ABNORMAL LOW (ref 25.1–33.5)
MCHC: 30.1 g/dL — ABNORMAL LOW (ref 31.5–35.8)
MCV: 60.5 fL — ABNORMAL LOW (ref 78.0–96.0)
Monocytes Absolute Automated: 0.54 10*3/uL (ref 0.21–0.85)
Monocytes: 11.6 %
Neutrophils Absolute: 2.03 10*3/uL (ref 1.10–6.33)
Neutrophils: 43.4 %
Nucleated RBC: 0 /100 WBC (ref 0.0–0.0)
Platelets: 426 10*3/uL — ABNORMAL HIGH (ref 142–346)
RBC: 4.28 10*6/uL (ref 3.90–5.10)
RDW: 20 % — ABNORMAL HIGH (ref 11–15)
WBC: 4.67 10*3/uL (ref 3.10–9.50)

## 2021-03-17 LAB — LIPID PANEL
Cholesterol / HDL Ratio: 2.9 Index
Cholesterol: 102 mg/dL (ref 0–199)
HDL: 35 mg/dL — ABNORMAL LOW (ref 40–9999)
LDL Calculated: 60 mg/dL (ref 0–99)
Triglycerides: 35 mg/dL (ref 34–149)
VLDL Calculated: 7 mg/dL — ABNORMAL LOW (ref 10–40)

## 2021-03-17 LAB — PHOSPHORUS: Phosphorus: 5.3 mg/dL — ABNORMAL HIGH (ref 2.3–4.7)

## 2021-03-17 LAB — HEMOGLOBIN A1C
Average Estimated Glucose: 85.3 mg/dL
Hemoglobin A1C: 4.6 % (ref 4.6–5.9)

## 2021-03-17 LAB — GFR: EGFR: >60

## 2021-03-17 LAB — CBC PATHOLOGIST REVIEW

## 2021-03-17 MED ORDER — HYDROXYZINE HCL 10 MG PO TABS
10.0000 mg | ORAL_TABLET | Freq: Three times a day (TID) | ORAL | Status: DC | PRN
Start: 2021-03-17 — End: 2021-03-17

## 2021-03-17 MED ORDER — HYDROXYZINE HCL 10 MG PO TABS
10.0000 mg | ORAL_TABLET | Freq: Three times a day (TID) | ORAL | 0 refills | Status: AC | PRN
Start: 2021-03-17 — End: ?

## 2021-03-17 MED ORDER — SODIUM CHLORIDE 0.9 % IV BOLUS
500.0000 mL | Freq: Once | INTRAVENOUS | Status: AC
Start: 2021-03-17 — End: 2021-03-17
  Administered 2021-03-17: 09:00:00 500 mL via INTRAVENOUS

## 2021-03-17 NOTE — Plan of Care (Signed)
Pt alert and oriented x4, pain management with prn percocet, NPO status for stress test, tele monitor in place, urine sample send to lab, hourly rounding and call light in reach.   Problem: Safety  Goal: Patient will be free from injury during hospitalization  Outcome: Progressing  Flowsheets (Taken 03/17/2021 0211)  Patient will be free from injury during hospitalization:   Use appropriate transfer methods   Hourly rounding     Problem: Pain  Goal: Pain at adequate level as identified by patient  Outcome: Progressing  Flowsheets (Taken 03/16/2021 1831 by Mirian Capuchin, RN)  Pain at adequate level as identified by patient:   Identify patient comfort function goal   Assess for risk of opioid induced respiratory depression, including snoring/sleep apnea. Alert healthcare team of risk factors identified.   Assess pain on admission, during daily assessment and/or before any "as needed" intervention(s)   Reassess pain within 30-60 minutes of any procedure/intervention, per Pain Assessment, Intervention, Reassessment (AIR) Cycle   Evaluate if patient comfort function goal is met   Evaluate patient's satisfaction with pain management progress   Offer non-pharmacological pain management interventions     Problem: Side Effects from Pain Analgesia  Goal: Patient will experience minimal side effects of analgesic therapy  Outcome: Progressing  Flowsheets (Taken 03/16/2021 1832 by Mirian Capuchin, RN)  Patient will experience minimal side effects of analgesic therapy:   Monitor/assess patient's respiratory status (RR depth, effort, breath sounds)   Assess for changes in cognitive function   Prevent/manage side effects per LIP orders (i.e. nausea, vomiting, pruritus, constipation, urinary retention, etc.)   Evaluate for opioid-induced sedation with appropriate assessment tool (i.e. POSS)

## 2021-03-17 NOTE — Discharge Instr - Activity (Signed)
As tolerated

## 2021-03-17 NOTE — Discharge Summary -  Nursing (Signed)
Pt discharged home. Discharge instructions given and discussed with pt, pt verbalized understanding. PIV and tele removed, belongings accounted for. Pt wheeled off the floor by transport.

## 2021-03-17 NOTE — Plan of Care (Signed)
Problem: Safety  Goal: Patient will be free from injury during hospitalization  Outcome: Adequate for Discharge  Goal: Patient will be free from infection during hospitalization  Outcome: Adequate for Discharge     Problem: Pain  Goal: Pain at adequate level as identified by patient  Outcome: Adequate for Discharge     Problem: Side Effects from Pain Analgesia  Goal: Patient will experience minimal side effects of analgesic therapy  Outcome: Adequate for Discharge     Problem: Discharge Barriers  Goal: Patient will be discharged home or other facility with appropriate resources  Outcome: Adequate for Discharge     Problem: Psychosocial and Spiritual Needs  Goal: Demonstrates ability to cope with hospitalization/illness  Outcome: Adequate for Discharge

## 2021-03-17 NOTE — Discharge Instr - AVS First Page (Signed)
Reason for your Hospital Admission:  Chest pain       Instructions for after your discharge:  See cardiology as outpatient   See your PCP for follow up visit   Seek immediate medical attention in case of worsening symptoms

## 2021-03-17 NOTE — Discharge Instr - Diet (Signed)
Cardiac  diet

## 2021-03-17 NOTE — Progress Notes (Signed)
[place] [facility] PROGRESS NOTE  [facility]     Date Time: 03/17/21 9:54 AM  Patient Name: Heather Bond,Heather Bond  Date of Birth: 1981/05/16  CSN: [id number]  MRN: [id number]       Assessment:   Chest pain, atypical, noted in the setting of very significant personal stress.  Troponins are negative in spite of prolonged pain.  EKG with nonspecific changes.  No known history of cardiac disease.  She reports a "normal" echocardiogram May of this year in [place].  History of iron deficiency anemia with decline in hemoglobin overnight  Relatively recent hysterectomy which limits her ability to walk on a treadmill at this time  No history of diabetes, hypertension, and tobacco use    Plan:   Options discussed.  Lexiscan nuclear study could be pursued though I have some concern with radiation dose in a 40 year old woman.  She reports that she is not currently able to exercise on a treadmill due to discomfort from her relatively recent hysterectomy.:  Overall suspicion for obstructive coronary disease is very low.  She did have prolonged pain with negative troponins.  At this time, we will not pursue an ischemic evaluation.  Fortunately she reports that her symptoms have currently resolved and she is overall feeling better.  I have encouraged her to follow-up with cardiologist upon her return to [place] and could consider treadmill based testing at that time.  She is agreeable with this plan as outlined    Signed by: Sandria Senter, MD    Subjective:   Denies chest pain, SOB or palpitations.    Hospital Medications:      Scheduled Meds: PRN Meds:    enoxaparin, 40 mg, Subcutaneous, Daily  ferrous sulfate, 324 mg, Oral, Daily  sodium chloride, 500 mL, Intravenous, Once        Continuous Infusions:   acetaminophen, 650 mg, Q6H PRN   Or  acetaminophen, 650 mg, Q6H PRN  glucagon (rDNA), 1 mg, PRN   And  dextrose, 250 mL, PRN   And  dextrose, 25 g, PRN   And  dextrose, 25 g, PRN  magnesium sulfate, 1 g, PRN  melatonin, 3 mg,  QHS PRN  naloxone, 0.2 mg, PRN  ondansetron, 4 mg, Q6H PRN   Or  ondansetron, 4 mg, Q6H PRN  oxyCODONE-acetaminophen, 1 tablet, Q4H PRN  potassium & sodium phosphates, 2 packet, PRN  potassium chloride, 0-40 mEq, PRN   And  potassium chloride, 10 mEq, PRN              Home Medications:     Medications Prior to Admission   Medication Sig Dispense Refill Last Dose    docusate sodium (COLACE) 100 MG capsule Take 100 mg by mouth 2 (two) times daily   Past Week    ferrous sulfate 325 (65 FE) MG tablet Take 325 mg by mouth every morning with breakfast   03/16/2021    ibuprofen (ADVIL) 800 MG tablet Take 800 mg by mouth every 6 (six) hours as needed for Pain   Past Week    naproxen sodium (ANAPROX) 550 MG tablet Take 550 mg by mouth 2 (two) times daily with meals   Past Week    ondansetron (ZOFRAN-ODT) 4 MG disintegrating tablet Take 4 mg by mouth every 8 (eight) hours as needed for Nausea   03/15/2021    oxyCODONE-acetaminophen (PERCOCET) 5-325 MG per tablet Take 1 tablet by mouth every 4 (four) hours as needed for Pain   Past Week  Meth-Hyo-M Bl-Na Phos-Ph Sal (URO-MP PO) Take by mouth            Physical Exam:     VITAL SIGNS   Temp:  [97.6 F (36.4 C)-99.7 F (37.6 C)] 98.8 F (37.1 C)  Heart Rate:  [64-95] 73  Resp Rate:  [16-26] 17  BP: (93-111)/(50-82) 104/53  No data recorded  SpO2: 100 %    Intake/Output Summary (Last 24 hours) at 03/17/2021 0954  Last data filed at 03/17/2021 1610  Gross per 24 hour   Intake 350 ml   Output 0 ml   Net 350 ml          GENERAL: Patient is in no acute distress   HEENT: Disconjugate gaze  NECK: No jugular venous distention, normal carotid upstrokes without bruits   CARDIAC: Regular rate and rhythm, with normal S1 and S2, and no murmurs, rubs, or gallops   CHEST: Clear to auscultation bilaterally, normal respiratory effort  ABDOMEN: Nontender, non-distended  EXTREMITIES: No edema  PULSES: Full and equal bilaterally.  NEUROLOGIC: Alert and oriented  MUSCULOSKELETAL: Muscle strength  and tone not tested      Labs:     CBC w/Diff   Recent Labs   Lab 03/17/21  0327 03/16/21  1201   WBC 4.67 5.05   Hgb 7.8* 8.9*   Hematocrit 25.9* 29.2*   Platelets 426* 479*          Comprehensive Metabolic Profile   Recent Labs   Lab 03/17/21  0327 03/16/21  1201   Sodium 137 138   Potassium 4.1 3.9   Chloride 107 106   CO2 23 23   BUN 10.0 7.0   Creatinine 0.6 0.7   Calcium 9.0 9.8   Albumin 3.6 4.1   Protein, Total 7.5 8.8*   Bilirubin, Total 0.8 1.1   Alkaline Phosphatase 51 63   ALT 10 10   AST (SGOT) 13 13   Glucose 76 75   Magnesium 1.8  --           Cardiac Enzymes   Recent Labs   Lab 03/16/21  1851 03/16/21  1201   Troponin I 0.01 <0.01       No results found for: BNP       Thyroid Studies          Invalid input(s): FREET4        Lipid Profile   Recent Labs   Lab 03/17/21  0327   Cholesterol 102          Coagulation Studies       No results found for: DDIMER         Telemetry:   Normal sinus rhythm    Rads:   CT Angio Chest    Result Date: 03/16/2021  INDICATION: pleuritic cp and sob s/p hysterectomy r/o pe.   TECHNIQUE: CT ANGIOGRAM CHEST:  Multidetector imaging was performed on a multislice scanner from the thoracic inlet through the diaphragms. Note: Note that CT scanning at this site  utilizes multiple dose reduction techniques including automatic exposure control, adjustment of the MAS and/or KVP according to patient's size and use of iterative reconstruction technique IV Contrast: 100 cc  Omnipaque 350. Acquisition phase of imaging: PE protocol. Oral contrast was not given. Reconstructions: 3d MIP images in multiple planes  FINDINGS: There is good opacification of the pulmonary arteries. There is no central pulmonary embolus or detectable peripheral pulmonary embolus. The thoracic aorta is nonaneurysmal and shows no evidence  of dissection.  The lungs appear clear. The heart is normal in size.    The mediastinum is within normal limits. Upper abdomen unremarkable. No bony destructive lesion       CT  angiogram of the chest shows no central or detectable peripheral pulmonary embolism. Laurena Slimmer, MD  03/16/2021 1:12 PM            [facility]  NP [facility] [phone] (8am-5pm)  MD [facility] [phone] or [phone]  Dr Letitia Neri [phone] (8am-5pm)  After hours, non urgent consult line [phone]  After Hours, urgent consults [phone]

## 2021-03-17 NOTE — Final Progress Note (DC Note for stay less than 48 (Addendum)
Date:03/17/2021   Patient Name: Heather Bond,Heather Bond  Attending Physician: Derek Mound, MD  Today:   BP 95/55   Pulse 72   Temp 98.1 F (36.7 C) (Oral)   Resp 17   Ht 1.753 m (5\' 9" )   Wt 64.5 kg (142 lb 1.6 oz)   SpO2 99%   BMI 20.98 kg/m   Ranges for the last 24 hours:  Temp:  [97.6 F (36.4 C)-98.8 F (37.1 C)] 98.1 F (36.7 C)  Heart Rate:  [64-91] 72  Resp Rate:  [16-20] 17  BP: (93-107)/(50-82) 95/55    Date of Admission:   03/16/2021    Date of Discharge:    MUST BE LESS THAN 48 HOURS FROM ADMISSION TO USE THIS NOTE    Outcome of Hospitalization:   Principal Problem:    Chest pain  Resolved Problems:    * No resolved hospital problems. *     Significant Events:   Chest pain, resolved. Likely related to recent emotional stressors.   EKG reviewed with cardiology. Troponin negative.   Cleared by cardiology for [place] with op follow up.   CTA chest negative for PE    [place] instructions given     Return instructions given     Pt discharged in medically stable condition.     Lab Results last 48 Hours       Procedure Component Value Units Date/Time    CBC Pathologist Review [[id number]] Collected: 03/16/21 1201     Updated: 03/17/21 0920     CBC Pathologist Review See Note    Hemoglobin A1C [[id number]] Collected: 03/17/21 0327    Specimen: Blood Updated: 03/17/21 0726     Hemoglobin A1C 4.6 %      Average Estimated Glucose 85.3 mg/dL     Narrative:      This is NOT the correct Test for Patients with  Hemoglobinopathy.    Lipid panel [[id number]]  (Abnormal) Collected: 03/17/21 0327    Specimen: Blood Updated: 03/17/21 0708     Cholesterol 102 mg/dL      Triglycerides 35 mg/dL      HDL 35 mg/dL      LDL Calculated 60 mg/dL      VLDL Calculated 7 mg/dL      Cholesterol / HDL Ratio 2.9 Index     Narrative:      This is NOT the correct Test for Patients with  Hemoglobinopathy.    Comprehensive metabolic panel [[id number]]  (Abnormal) Collected: 03/17/21 0327    Specimen: Blood Updated: 03/17/21 0456     Glucose 76  mg/dL      BUN 62.9 mg/dL      Creatinine 0.6 mg/dL      Sodium 528 mEq/L      Potassium 4.1 mEq/L      Chloride 107 mEq/L      CO2 23 mEq/L      Calcium 9.0 mg/dL      Protein, Total 7.5 g/dL      Albumin 3.6 g/dL      AST (SGOT) 13 U/L      ALT 10 U/L      Alkaline Phosphatase 51 U/L      Bilirubin, Total 0.8 mg/dL      Globulin 3.9 g/dL      Albumin/Globulin Ratio 0.9     Anion Gap 7.0    Narrative:      This is NOT the correct Test for Patients with  Hemoglobinopathy.    Magnesium [[id number]] Collected:  03/17/21 0327    Specimen: Blood Updated: 03/17/21 0456     Magnesium 1.8 mg/dL     Narrative:      This is NOT the correct Test for Patients with  Hemoglobinopathy.    Phosphorus [[id number]]  (Abnormal) Collected: 03/17/21 0327    Specimen: Blood Updated: 03/17/21 0456     Phosphorus 5.3 mg/dL     Narrative:      This is NOT the correct Test for Patients with  Hemoglobinopathy.    GFR [[id number]] Collected: 03/17/21 0327     Updated: 03/17/21 0456     EGFR >60.0    Narrative:      This is NOT the correct Test for Patients with  Hemoglobinopathy.    CBC and differential [[id number]]  (Abnormal) Collected: 03/17/21 0327    Specimen: Blood Updated: 03/17/21 0415     WBC 4.67 x10 3/uL      Hgb 7.8 g/dL      Hematocrit 08.6 %      Platelets 426 x10 3/uL      RBC 4.28 x10 6/uL      MCV 60.5 fL      MCH 18.2 pg      MCHC 30.1 g/dL      RDW 20 %      MPV Unmeasured fL      Neutrophils 43.4 %      Lymphocytes Automated 41.1 %      Monocytes 11.6 %      Eosinophils Automated 2.6 %      Basophils Automated 1.1 %      Immature Granulocytes 0.2 %      Nucleated RBC 0.0 /100 WBC      Neutrophils Absolute 2.03 x10 3/uL      Lymphocytes Absolute Automated 1.92 x10 3/uL      Monocytes Absolute Automated 0.54 x10 3/uL      Eosinophils Absolute Automated 0.12 x10 3/uL      Basophils Absolute Automated 0.05 x10 3/uL      Immature Granulocytes Absolute 0.01 x10 3/uL      Absolute NRBC 0.00 x10 3/uL     Narrative:      This is NOT the  correct Test for Patients with  Hemoglobinopathy.    Urinalysis Reflex to Microscopic Exam [[id number]]  (Abnormal) Collected: 03/16/21 2311    Specimen: Urine Updated: 03/16/21 2332     Urine Type Clean Catch     Color, UA Colorless     Clarity, UA Clear     Specific Gravity UA >=1.030     Urine pH 6.0     Leukocyte Esterase, UA trace     Nitrite, UA Negative     Protein, UR trace     Glucose, UA Negative     Ketones UA Negative     Urobilinogen, UA 0.2 mg/dL      Bilirubin, UA Negative     Blood, UA Negative     RBC, UA 6 - 10 /hpf      WBC, UA 0 - 5 /hpf      Squamous Epithelial Cells, Urine 0 - 5 /hpf      Urine Mucus Present    Troponin I [[id number]] Collected: 03/16/21 1851    Specimen: Blood Updated: 03/16/21 1924     Troponin I 0.01 ng/mL     Beta HCG Quantitative [[id number]] Collected: 03/16/21 1851     Updated: 03/16/21 1923     hCG, Quant. <2.4 mIU/mL  Troponin I [[id number]] Collected: 03/16/21 1851    Specimen: Blood Updated: 03/16/21 1851    Cell MorpHology [[id number]]  (Abnormal) Collected: 03/16/21 1201     Updated: 03/16/21 1451     Cell Morphology Abnormal     Platelet Estimate Increased     Microcytic 3+     Hypochromia 3+     Schistocytes 1+     Ovalocytes Present     Dual RBC Population Present    CBC and differential [[id number]]  (Abnormal) Collected: 03/16/21 1201    Specimen: Blood Updated: 03/16/21 1451     WBC 5.05 x10 3/uL      Hgb 8.9 g/dL      Hematocrit 08.6 %      Platelets 479 x10 3/uL      RBC 4.87 x10 6/uL      MCV 60.0 fL      MCH 18.3 pg      MCHC 30.5 g/dL      RDW 20 %      MPV Unmeasured fL      Neutrophils 70.7 %      Lymphocytes Automated 20.0 %      Monocytes 7.7 %      Eosinophils Automated 0.2 %      Basophils Automated 1.0 %      Immature Granulocytes 0.4 %      Nucleated RBC 0.0 /100 WBC      Neutrophils Absolute 3.57 x10 3/uL      Lymphocytes Absolute Automated 1.01 x10 3/uL      Monocytes Absolute Automated 0.39 x10 3/uL      Eosinophils Absolute Automated 0.01 x10  3/uL      Basophils Absolute Automated 0.05 x10 3/uL      Immature Granulocytes Absolute 0.02 x10 3/uL      Absolute NRBC 0.00 x10 3/uL     ABO/Rh [[id number]] Collected: 03/16/21 1247    Specimen: Blood Updated: 03/16/21 1321     ABO Rh O POS    Type and Screen [[id number]] Collected: 03/16/21 1201    Specimen: Blood Updated: 03/16/21 1253     ABO Rh O POS     AB Screen Gel NEG    COVID-19 (SARS-COV-2) ([place] [facility]) [[id number]] Collected: 03/16/21 1201    Specimen: Nasopharyngeal Swab from Nasopharynx Updated: 03/16/21 1252     Purpose of COVID testing Screening     SARS-CoV-2 Specimen Source Nasopharyngeal     SARS CoV-2 Negative    Narrative:      o Collect and clearly label specimen type:  o Upper respiratory specimen: One Nasopharyngeal Dry Swab NO  Transport Media.  o Hand deliver to laboratory ASAP  Indication for testing->Extended care facility admission to  semi private room  Screening    Troponin I [[id number]] Collected: 03/16/21 1201    Specimen: Blood Updated: 03/16/21 1231     Troponin I <0.01 ng/mL     [facility] Request [[id number]] Collected: 03/16/21 1201     Updated: 03/16/21 1231     2nd [facility] Request FT    Comprehensive metabolic panel [[id number]]  (Abnormal) Collected: 03/16/21 1201    Specimen: Blood Updated: 03/16/21 1229     Glucose 75 mg/dL      BUN 7.0 mg/dL      Creatinine 0.7 mg/dL      Sodium 416 mEq/L      Potassium 3.9 mEq/L      Chloride 106 mEq/L      CO2 23 mEq/L  Calcium 9.8 mg/dL      Protein, Total 8.8 g/dL      Albumin 4.1 g/dL      AST (SGOT) 13 U/L      ALT 10 U/L      Alkaline Phosphatase 63 U/L      Bilirubin, Total 1.1 mg/dL      Globulin 4.7 g/dL      Albumin/Globulin Ratio 0.9     Anion Gap 9.0    GFR [[id number]] Collected: 03/16/21 1201     Updated: 03/16/21 1229     EGFR >60.0            Procedures performed:   Radiology: all results in the last 24 hours  CT Angio Chest    Result Date: 03/16/2021    CT angiogram of the chest shows no central or detectable peripheral  pulmonary embolism. Laurena Slimmer, MD  03/16/2021 1:12 PM     Treatment Team:   Attending Provider: Derek Mound, MD    Disposition:   Disposition:     Condition at Discharge:   stable     Follow up Recommendations for Receiving Provider     Follow up with cardiology as op.   Unresulted Labs       Procedure . . . Date/Time    Troponin I [[id number]] Collected: 03/16/21 1851    Specimen: Blood Updated: 03/16/21 1851            Discharge Instructions:      Follow-up Information       Pcp, None, MD .                                Discharge Medication List        Taking      docusate sodium 100 MG capsule  Dose: 100 mg  Commonly known as: COLACE  Take 100 mg by mouth 2 (two) times daily     ferrous sulfate 325 (65 FE) MG tablet  Dose: 325 mg  Take 325 mg by mouth every morning with breakfast     hydrOXYzine 10 MG tablet  Dose: 10 mg  Commonly known as: ATARAX  Take 1 tablet (10 mg total) by mouth every 8 (eight) hours as needed for Itching     ondansetron 4 MG disintegrating tablet  Dose: 4 mg  Commonly known as: ZOFRAN-ODT  Take 4 mg by mouth every 8 (eight) hours as needed for Nausea     oxyCODONE-acetaminophen 5-325 MG per tablet  Dose: 1 tablet  Commonly known as: PERCOCET  Take 1 tablet by mouth every 4 (four) hours as needed for Pain     URO-MP PO  Take by mouth            STOP taking these medications      ibuprofen 800 MG tablet  Commonly known as: ADVIL     naproxen sodium 550 MG tablet  Commonly known as: ANAPROX              Signed by: Derek Mound, MD

## 2021-03-17 NOTE — UM Notes (Signed)
[facility] Utilization Review  NPI: [id number], Tax ID: [id number]  Please Call [phone] with any questions or concerns. Confidential voicemail.  Fax final authorizations and requests for additional information Fax to [phone]       Patient: Heather Bond  Date: 03/16/2021   DOB: 02-18-1981  Admission Date: 03/16/2021     OBSERVATION CLINICAL 03/16/21  CHEST PAIN  HPI: Heather Bond is a 40 y.o. female presenting to the ED with moderate severity, sudden onset, central nonradiating heavy chest pain with associated shortness of breath that started about 1.5 hours prior to arrival today.  No clear exacerbating relieving factors to symptoms.  Patient also feels lightheaded and weak.  She had hysterectomy 2 weeks ago complicated by severe blood loss, she states hemoglobin felt to 1.5 and she required multiple units of blood transfusions.  Denies any baseline medical problems except for fibroids.  No history of PE or DVT.  No exogenous hormone use.  No fever, chills, sore throat, runny nose, or cough.  No clear exacerbating or relieving factors symptoms.  She states she has not had any issues with bleeding since the surgery.  PCP: Pcp, None, MD  BP 111/73   Pulse 95   Temp 99.7 F (37.6 C)   Resp (!) 26   SpO2 99%   CT Angio Chest   CT angiogram of the chest shows no central or detectable peripheral pulmonary embolism.  EKG:  Rate: 105  Rhythm: sinus tachycardia    Medication Administration from 03/16/2021 1057 to 03/16/2021 1658   Date/Time Order Dose Route Action Action by Comments    03/16/2021 1208 EDT fentaNYL (PF) (SUBLIMAZE) injection 25 mcg 25 mcg Intravenous Given Samuel Germany, RN --    03/16/2021 1259 EDT iohexol (OMNIPAQUE) 350 MG/ML injection 100 mL 100 mL Intravenous Imaging Agent Given Hiers, Joaquim --     Diagnosis   Clinical Impression:   1. Chest pain      Admit to Observation (Order [id number])  Admission  Date: 03/16/2021 Department: [facility] [address] [facility]      ASSESSMENT & PLAN      Heather Bond is a 40 y.o. female admitted under obs with Chest pain.     Patient Active Hospital Problem List:  Chest pain (03/16/2021)      Chest pain, resolved  -Likely 2/2 anxiety given recent stress  -Given abnormal EKG with lateral T wave inversions pt admitted for further workup   -Stress test in am   -Cariology consulted, appreciate recs  -NPO at midnight   -Troponin negative. Serial troponins ordered  -CTA chest negative for PE  -Monitor on tele   -Check lipids and A1c     Iron def anemia   -S/p hysterectomy  -Continue PTA ferrous sulfate   -OP follow up      Nutrition  Regular  NPO at midnight      DVT/VTE Prophylaxis  Lovenox      Cardiologist Assessment:   Atypical chest pain: Substernal chest pressure in the setting of emotional stress and lateral T wave inversions. No known CAD. Chest pain has now improved. Troponin negative. Negative for pulmonary embolism.     Recommendations:   Regadenoson nuclear stress test.

## 2023-01-25 IMAGING — MR MRI THORACIC SPINE WITH AND  WITHOUT CONTRAST
11 of 19 series · 24 of 48 positions shown · IV contrast (agent unspecified)
Comparison: None.

FINAL REPORT:
MRI THORACIC SPINE WITH AND  WITHOUT CONTRAST
INDICATION: Other symptoms and signs involving the musculoskeletal system
Other specified abnormal immunological findings in serum.
TECHNIQUE: Multisequence multiplanar magnetic resonance imaging of the thoracic spine without and with contrast.

[Series 2: fastview whole spine · axial · 5.0mm · 5.00mm/px · z∈[-584,+204]mm · 12 of 250 slices shown]
[im 1/250]
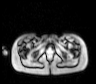
[im 23/250]
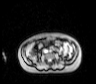
[im 46/250]
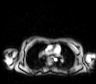
[im 68/250]
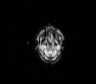
[im 91/250]
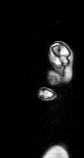
[im 114/250]
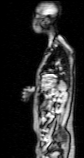
[im 136/250]
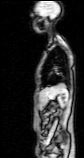
[im 159/250]
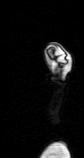
[im 182/250]
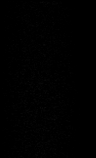
[im 204/250]
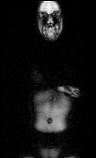
[im 227/250]
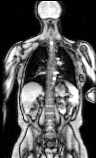
[im 250/250]
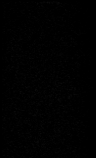

[Series 15: T2 · sagittal · 4.0mm · 0.76mm/px · 1 of 15 slices shown (1 of 3)]
[im 1/15]
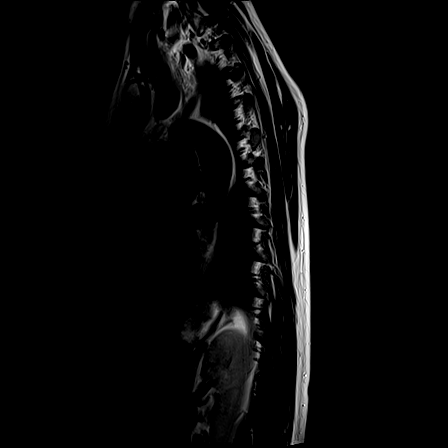

[Series 16: T1 · sagittal · 4.0mm · 1.06mm/px · 1 of 15 slices shown (1 of 3)]
[im 1/15]
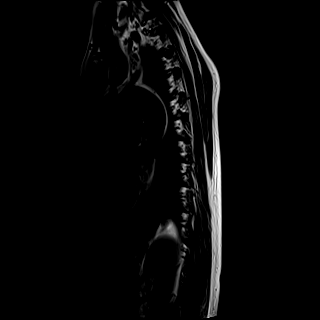

[Series 17: STIR · sagittal · 4.0mm · 0.89mm/px · 1 of 15 slices shown]
[im 1/15]
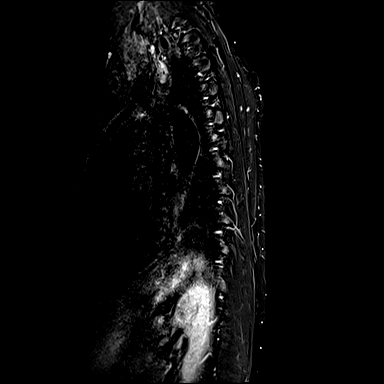

[Series 18: T2 · axial · 4.0mm · 0.78mm/px · z∈[-181,-12]mm · 2 of 40 slices shown (2 of 3)]
[im 1/40]
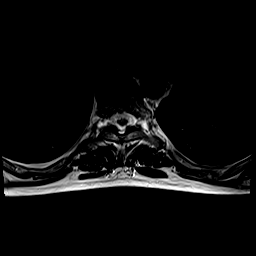
[im 40/40]
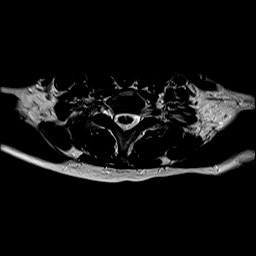

[Series 19: T2 · axial · 4.0mm · 0.78mm/px · z∈[-306,-134]mm · 2 of 40 slices shown (3 of 3)]
[im 1/40]
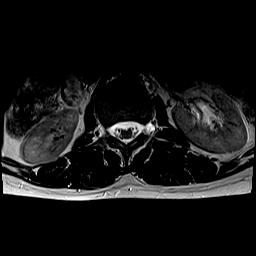
[im 40/40]
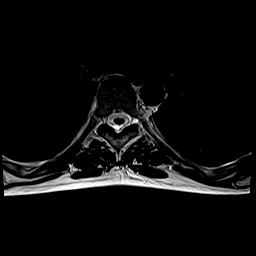

[Series 20: T1 · axial · non-contrast · 7.0mm · 0.39mm/px · 1 of 25 slices shown (2 of 3)]
[im 1/25]
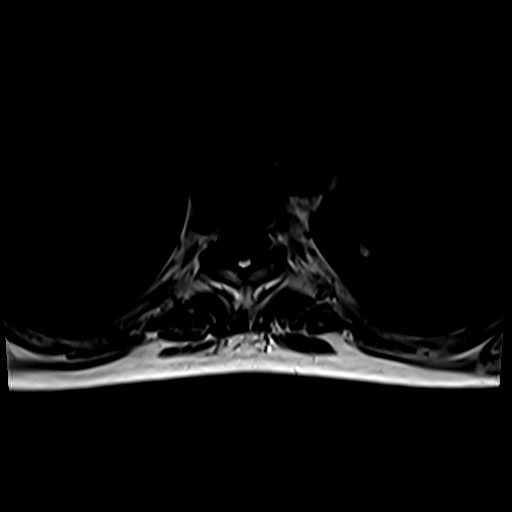

[Series 21: T1 · axial · non-contrast · 7.0mm · 0.39mm/px · 1 of 25 slices shown (3 of 3)]
[im 1/25]
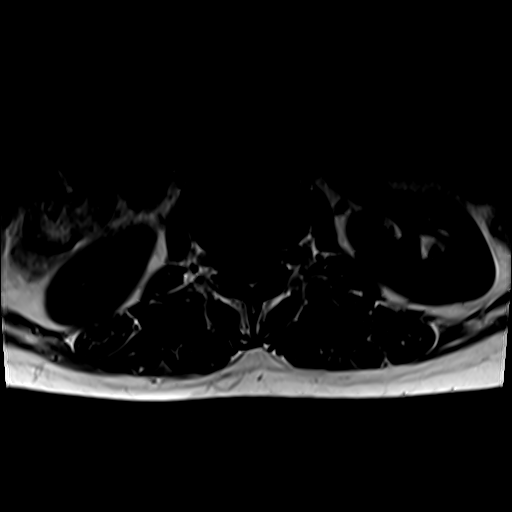

[Series 22: T1 fat-sat post-contrast · sagittal · 4.0mm · 1.06mm/px · 1 of 15 slices shown (1 of 3)]
[im 1/15]
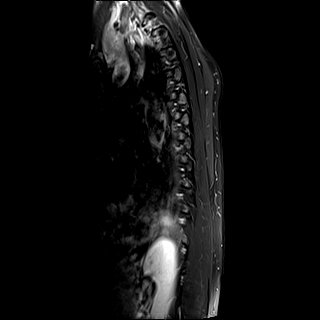

[Series 23: T1 fat-sat post-contrast · axial · 7.0mm · 0.39mm/px · 1 of 25 slices shown (2 of 3)]
[im 1/25]
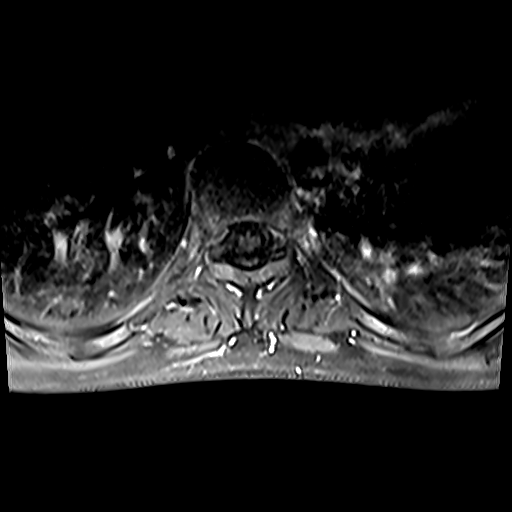

[Series 24: T1 fat-sat post-contrast · axial · 7.0mm · 0.39mm/px · 1 of 25 slices shown (3 of 3)]
[im 1/25]
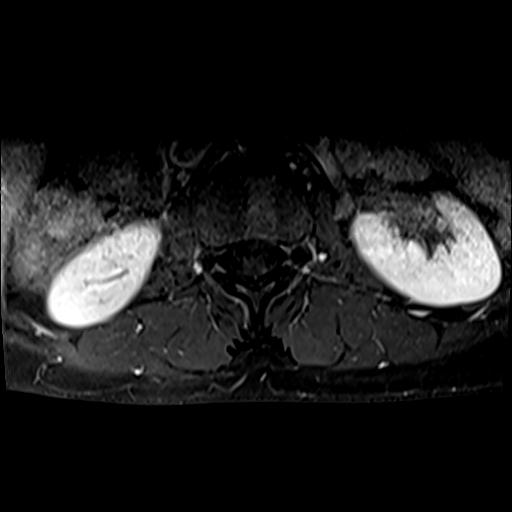

[24 of 48 positions shown; findings below may reference images not displayed]

FINDINGS: Alignment: Normal
Vertebral Bodies: Normal in height
Marrow Signal: There is a small vertebral body hemangioma within T10.
Intervertebral Discs: Normal in height
Spinal cord: Normal
Paraspinal Soft Tissues: Normal
Individual Levels: No significant spinal canal narrowing.
There are small bilateral perineural cysts at T1-2.
There is a perineural cyst on the left at T2-3.
There is a perineural cyst on the left at T3-4.
There are bilateral perineural cysts at T4-5.
There are bilateral perineural cyst at T5-6, right greater than left.
There are bilateral perineural cysts at T6-7.
There are bilateral perineural cysts at T7-8.
There are bilateral perineural cysts at T8-9.
There are bilateral perineural cysts at T9-10.
There are bilateral perineural cysts at T10-11.
There are bilateral perineural cysts at T11-T12.
There are bilateral foraminal cysts at T12-L1.
There is no abnormal enhancement.
IMPRESSION: 1. There are perineural cysts throughout the majority of the thoracic spine as described above. These are of uncertain clinical significance. Can correlate with symptoms.
2. Otherwise no acute findings in the thoracic spine. No significant degenerative changes of the thoracic spine.
Is the patient pregnant?
No

## 2023-06-06 IMAGING — RF FL ESOPHAGUS BARIUM SWALLOW
11 of 14 series · 14 of 24 positions shown · non-contrast
Comparison: None

Written/verbal discharge instructions given , Air kerma [id number], Time 2.33, Images 144, Dr Quirijn
FINAL REPORT:
AIR CONTRAST BARIUM SWALLOW
HISTORY: Other dysphagia
TECHNIQUE: The patient is given thin consistencies of barium, effervescent crystals, and then thick barium and is imaged in both the frontal and lateral projections. The patient is also given a barium tablet for ingestion and is imaged.

[Series 1: swallow 4/s · 0.08mm/px · 1 of 14 frames shown (1 of 11)]
[frame 3/14]
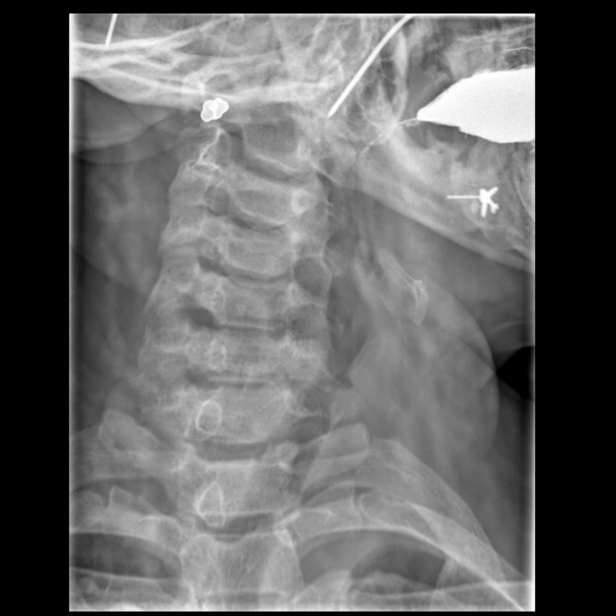

[Series 2: swallow 4/s · 0.08mm/px · 1 of 11 frames shown (2 of 11)]
[frame 2/11]
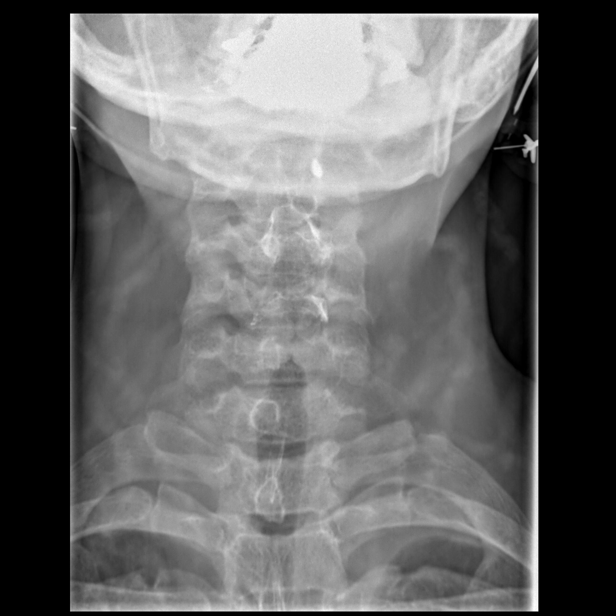

[Series 3: swallow 4/s · 0.17mm/px · 1 of 6 frames shown (3 of 11)]
[frame 4/6]
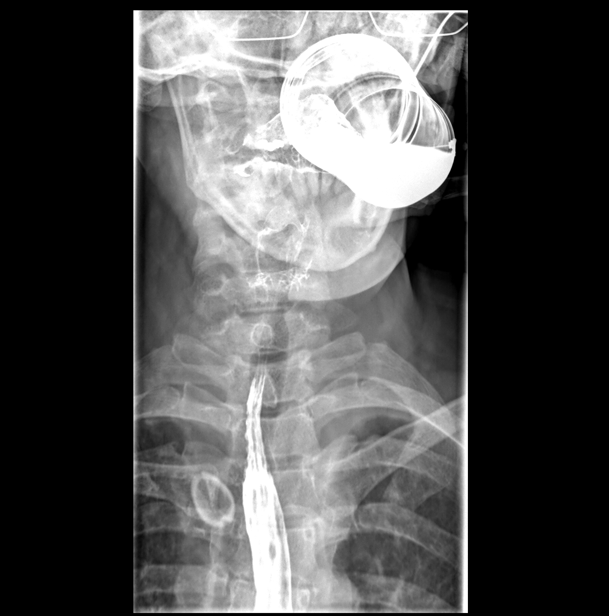

[Series 4: swallow 4/s · 0.17mm/px · 2 of 8 frames shown (4 of 11)]
[frame 3/8]
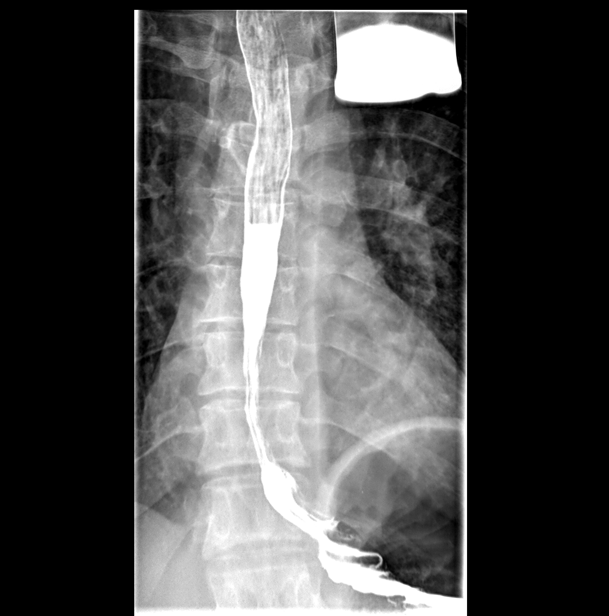
[frame 7/8]
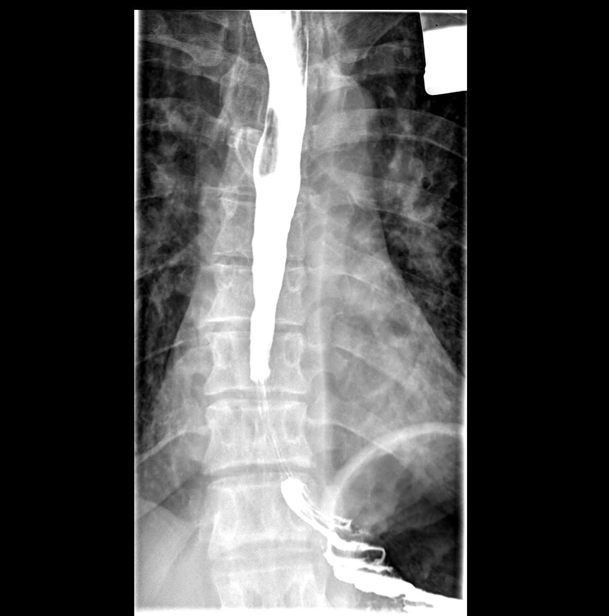

[Series 6: swallow 4/s · 0.17mm/px · 1 of 8 frames shown (5 of 11)]
[frame 1/8]
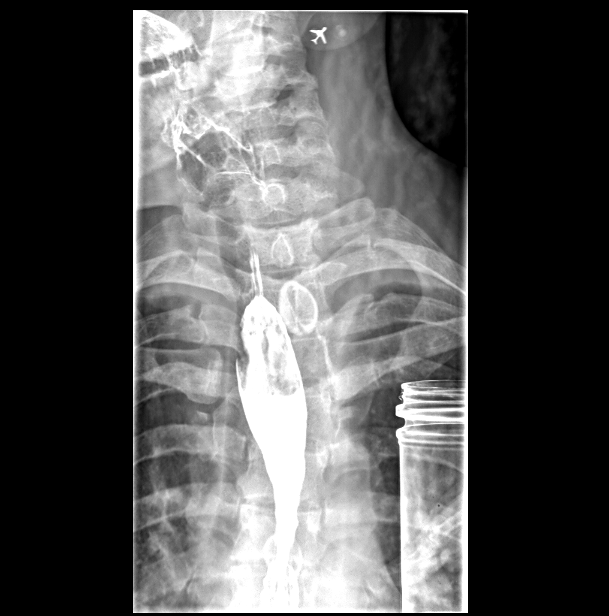

[Series 7: swallow 4/s · 0.17mm/px · 2 of 5 frames shown (6 of 11)]
[frame 1/5]
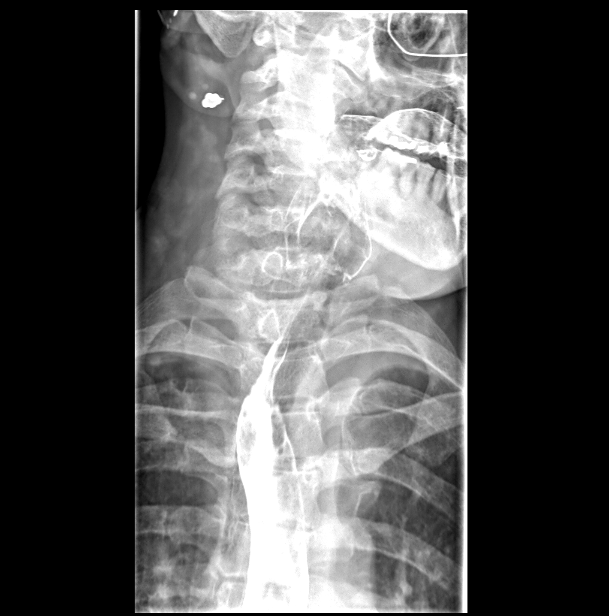
[frame 3/5]
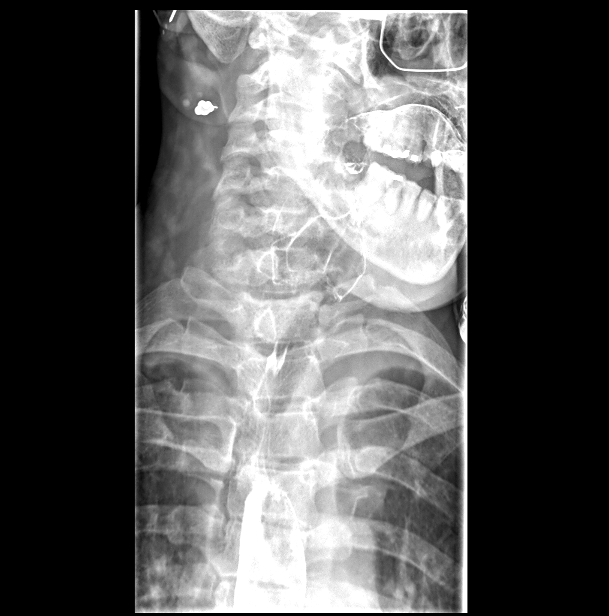

[Series 10: swallow 4/s · 0.17mm/px · 1 of 9 frames shown (7 of 11)]
[frame 2/9]
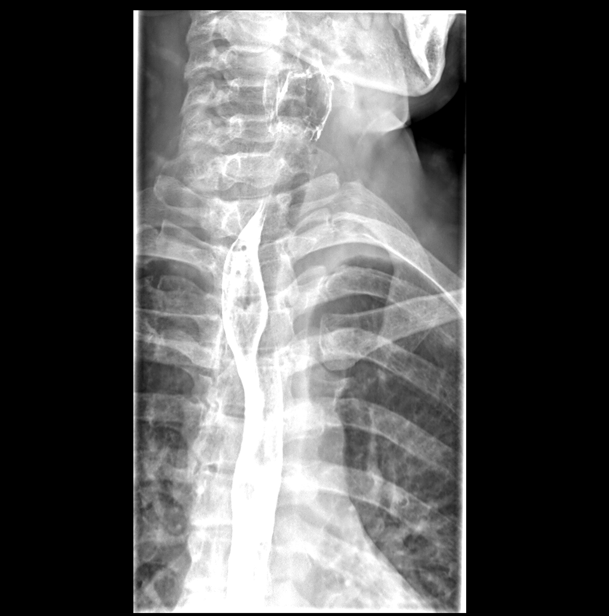

[Series 11: swallow 4/s · 0.17mm/px · 1 of 11 frames shown (8 of 11)]
[frame 6/11]
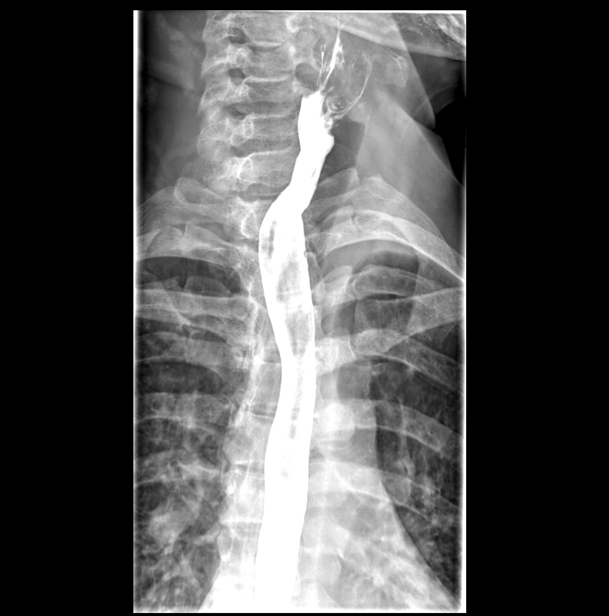

[Series 12: swallow 4/s · 0.17mm/px · 2 of 19 frames shown (9 of 11)]
[frame 10/19]
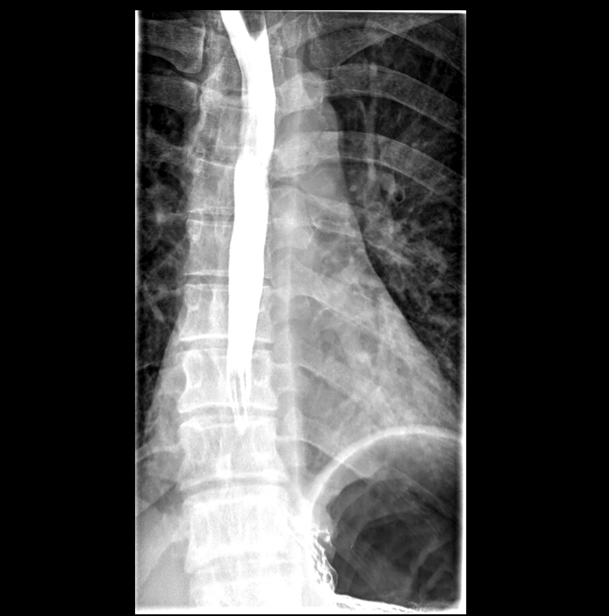
[frame 17/19]
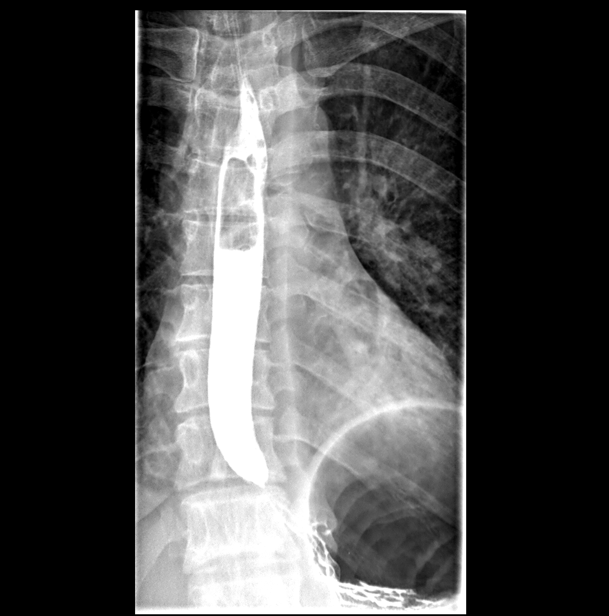

[Series 14: swallow 4/s · 0.18mm/px · 1 of 38 frames shown (10 of 11)]
[frame 33/38]
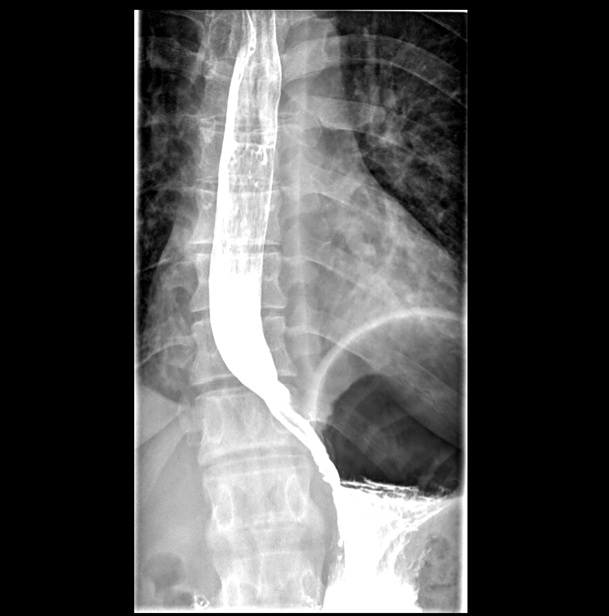

[Series 18: swallow 4/s · 0.18mm/px · 1 of 1 slices shown (11 of 11)]
[im 1/1]
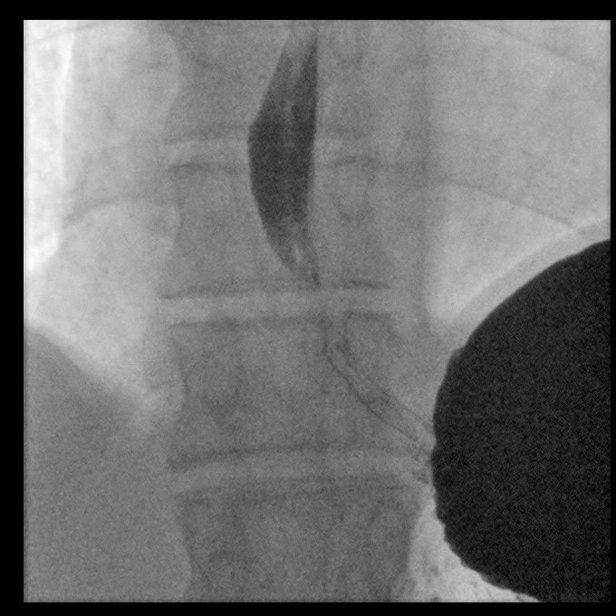

[14 of 24 positions shown; findings below may reference images not displayed]

FINDINGS: Air contrast barium swallow demonstrates normal swallowing function. Normal esophageal peristalsis. There is approximately 1 cm of subtle narrowing of the upper cervical esophagus at approximately C7. The remaining esophagus is widely patent. There is a small hiatal hernia.
Esophageal mucosal pattern appears unremarkable. No evidence of diverticulum.
Fluoroscopic time: 2.33 minutes
Fluoroscopic images: 144
Reference dose: Air kerma: 139 mGy
IMPRESSION: 
IMPRESSION: Subtle narrowing of the upper cervical esophagus without delay of contrast passage.
Small hiatal hernia.
Is the patient pregnant?
No
